# Patient Record
Sex: Male | Born: 1965 | Race: White | Hispanic: No | Marital: Married | Smoking: Never smoker
Health system: Southern US, Community
[De-identification: ages and names within clinical notes are randomized; demographics above are authoritative.]

## PROBLEM LIST (undated history)

## (undated) DIAGNOSIS — J301 Allergic rhinitis due to pollen: Secondary | ICD-10-CM

## (undated) DIAGNOSIS — D169 Benign neoplasm of bone and articular cartilage, unspecified: Secondary | ICD-10-CM

## (undated) DIAGNOSIS — M1990 Unspecified osteoarthritis, unspecified site: Secondary | ICD-10-CM

## (undated) DIAGNOSIS — T7840XA Allergy, unspecified, initial encounter: Secondary | ICD-10-CM

## (undated) HISTORY — DX: Benign neoplasm of bone and articular cartilage, unspecified: D16.9

## (undated) HISTORY — PX: COLONOSCOPY: SHX174

## (undated) HISTORY — DX: Allergic rhinitis due to pollen: J30.1

## (undated) HISTORY — DX: Unspecified osteoarthritis, unspecified site: M19.90

## (undated) HISTORY — DX: Allergy, unspecified, initial encounter: T78.40XA

## (undated) HISTORY — PX: OTHER SURGICAL HISTORY: SHX169

---
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
## 1997-10-23 DIAGNOSIS — D169 Benign neoplasm of bone and articular cartilage, unspecified: Secondary | ICD-10-CM

## 1997-10-23 HISTORY — DX: Benign neoplasm of bone and articular cartilage, unspecified: D16.9

## 1998-06-22 ENCOUNTER — Ambulatory Visit (HOSPITAL_BASED_OUTPATIENT_CLINIC_OR_DEPARTMENT_OTHER): Admission: RE | Admit: 1998-06-22 | Discharge: 1998-06-22 | Payer: Self-pay | Admitting: Orthopedic Surgery

## 2000-05-24 ENCOUNTER — Encounter: Admission: RE | Admit: 2000-05-24 | Discharge: 2000-06-27 | Payer: Self-pay | Admitting: Orthopedic Surgery

## 2005-06-14 ENCOUNTER — Encounter: Admission: RE | Admit: 2005-06-14 | Discharge: 2005-06-14 | Payer: Self-pay | Admitting: Specialist

## 2007-08-07 ENCOUNTER — Encounter: Payer: Self-pay | Admitting: Gastroenterology

## 2007-08-07 ENCOUNTER — Ambulatory Visit: Payer: Self-pay | Admitting: Internal Medicine

## 2007-08-08 LAB — CONVERTED CEMR LAB
Glucose, Bld: 81 mg/dL (ref 70–99)
LDL Cholesterol: 105 mg/dL — ABNORMAL HIGH (ref 0–99)

## 2012-10-08 ENCOUNTER — Telehealth: Payer: Self-pay | Admitting: Internal Medicine

## 2012-10-08 NOTE — Telephone Encounter (Signed)
Pt's wife called to schedule a CPE w/in the next month or so due to them looking into fostering children.  Due to his job he needs an appmt on Wed. Can you accommodate him for a CPE on a Wed w/in the next month? Thank you.

## 2012-10-08 NOTE — Telephone Encounter (Signed)
Left message on machine to have pt's wife return my call, pt last seen 2008, technically would be a new patient. Next CPE/ new patient will be in March.

## 2012-11-04 ENCOUNTER — Encounter: Payer: Self-pay | Admitting: Internal Medicine

## 2012-11-05 ENCOUNTER — Encounter: Payer: Self-pay | Admitting: Internal Medicine

## 2012-11-05 ENCOUNTER — Ambulatory Visit (INDEPENDENT_AMBULATORY_CARE_PROVIDER_SITE_OTHER): Payer: 59 | Admitting: Internal Medicine

## 2012-11-05 VITALS — BP 122/80 | HR 66 | Temp 97.7°F | Ht 67.5 in | Wt 191.0 lb

## 2012-11-05 DIAGNOSIS — Z23 Encounter for immunization: Secondary | ICD-10-CM

## 2012-11-05 DIAGNOSIS — Z0000 Encounter for general adult medical examination without abnormal findings: Secondary | ICD-10-CM

## 2012-11-05 DIAGNOSIS — J301 Allergic rhinitis due to pollen: Secondary | ICD-10-CM | POA: Insufficient documentation

## 2012-11-05 NOTE — Assessment & Plan Note (Signed)
Healthy Needs to work on fitness Tdap Will do forms for foster care

## 2012-11-05 NOTE — Addendum Note (Signed)
Addended by: Sueanne Margarita on: 11/05/2012 09:32 AM   Modules accepted: Orders
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
## 2012-11-07 ENCOUNTER — Encounter: Payer: Self-pay | Admitting: *Deleted

## 2012-11-07 ENCOUNTER — Telehealth: Payer: Self-pay | Admitting: *Deleted

## 2012-11-07 ENCOUNTER — Other Ambulatory Visit (INDEPENDENT_AMBULATORY_CARE_PROVIDER_SITE_OTHER): Payer: 59

## 2012-11-07 LAB — TB SKIN TEST
Induration: 0 mm
TB Skin Test: NEGATIVE

## 2012-11-07 LAB — LIPID PANEL
Cholesterol: 176 mg/dL (ref 0–200)
Total CHOL/HDL Ratio: 4

## 2012-11-07 NOTE — Telephone Encounter (Signed)
Sorry form on your desk now

## 2012-11-07 NOTE — Telephone Encounter (Signed)
Nothing on my desk.

## 2012-11-07 NOTE — Telephone Encounter (Signed)
Please see form on your desk to be filled out for foster care.

## 2012-11-07 NOTE — Telephone Encounter (Signed)
done

## 2012-11-08 NOTE — Telephone Encounter (Signed)
Left message at pt's work advising form is ready for pick up.  Form placed at front desk, copy sent for scanning.

## 2013-04-19 ENCOUNTER — Emergency Department: Payer: Self-pay | Admitting: Internal Medicine

## 2013-08-28 ENCOUNTER — Other Ambulatory Visit: Payer: Self-pay

## 2013-10-09 ENCOUNTER — Telehealth: Payer: Self-pay | Admitting: Family Medicine

## 2013-10-09 ENCOUNTER — Ambulatory Visit: Payer: 59 | Admitting: Family Medicine

## 2013-10-09 ENCOUNTER — Telehealth: Payer: Self-pay | Admitting: Internal Medicine

## 2013-10-09 NOTE — Telephone Encounter (Signed)
Will see today.  

## 2013-10-09 NOTE — Telephone Encounter (Signed)
Caller: Brenda/Spouse; PCP: [facility] ([facility]); CB#: [phone]; Call regarding Fever, congestion, cough and body aches; Onset: 10/06/13.  Temp 101.0 po at 0630.  Advised to see MD within 24 hours per nursing judgement for temperature of 101. for 4 days that has not responded to 24 hours of home care measures per Flu-Like Sympotoms. Dr Alphonsus Sias has no appointments fo 10/09/13. Appointment scheduled for 1400 10/09/13 with Dr Para March.

## 2013-10-09 NOTE — Telephone Encounter (Signed)
Sounds like the flu

## 2013-10-09 NOTE — Telephone Encounter (Signed)
[facility] Triage Call Report Triage Record Num: [id number] Operator: Geanie Berlin Patient Name: Tommy Franklin Call Date & Time: 10/09/2013 9:27:49AM Patient Phone: [phone] PCP: [facility] Abide Patient Gender: Male PCP Fax : [phone] Patient DOB: [birth year] Practice Name: [facility] Reason for Call: Caller: Wyline Copas; PCP: [facility]ide (Family [facility]); CB#: [phone]; Call regarding Fever, congestion, cough and body aches; Onset: 10/06/13. Temp 101.0 po at 0630. Advised to see MD within 24 hours per nursing judgement for temperature of 101. for 4 days that has not responded to 24 hours of home care measures per Flu-Like Sympotoms. Dr Alphonsus Sias has no appointments for 10/09/13. Appointment scheduled for 1400 10/09/13 with Dr Para March. Protocol(s) Used: Flu-Like Symptoms Protocol(s) Used: Upper Respiratory Infection (URI) Recommended Outcome per Protocol: See Provider within 24 hours Reason for Outcome: Temperature of 101.5 F (38.6 C) or greater that has not responded to 24 hours of home care measures Sudden onset of flu-like symptoms Care Advice: ~ Rest until symptoms improve. Aspirin should not be used in anyone, 48 years of age and under, for temperature control, pain, or aches when flu is a possibility. ~ ~ SYMPTOM / CONDITION MANAGEMENT Analgesic/Antipyretic Advice - Acetaminophen: Consider acetaminophen as directed on label or by pharmacist/provider for pain or fever. PRECAUTIONS: - Use only if there is no history of liver disease, alcoholism, or intake of three or more alcohol drinks per day. - If approved by provider when breastfeeding. - Do not exceed recommended dose or frequency. ~ Analgesic/Antipyretic Advice - NSAIDs: Consider aspirin, ibuprofen, naproxen or ketoprofen for pain or fever as directed on label or by pharmacist/provider. PRECAUTIONS: - If over 96 years of age, should not take longer than 1 week without consulting  provider. EXCEPTIONS: - Should not be used if taking blood thinners or have bleeding problems. - Do not use if have history of sensitivity/allergy to any of these medications; or history of cardiovascular, ulcer, kidney, liver disease or diabetes unless approved by provider. - Do not exceed recommended dose or frequency. ~ Influenza - Expected Course: - Symptoms start to improve in 3 to 7 days. - Cough and feeling tired (malaise) may continue for several weeks. - Cough and extreme fatigue lasting more than 3 weeks needs medical evaluation. - Remain at home at least 24 hours after being free of fever 100F (37.8C) without the use of fever reducing medication. ~ Influenza - Transmission: - Flu virus is spread through the air in droplets when a flu-infected person coughs, sneezes or talks and another person breathes in the droplets, or by touching a surface like a door knob, telephone, or keyboard that has been contaminated ~ 10/09/2013 9:51:50AM Page 1 of 2 CAN_[id number]_V2 [facility] Triage Call Report Patient Name: Tommy Franklin continuation page/s by the droplets and then touching the mouth, nose, or eyes. - An individual may be passing on the flu before they have any symptoms. - An individual may continue to be contagious with the flu for up to 7 days after the symptoms start. H1N1 influenza may continue to be contagious as long as cough persists. Influenza Respiratory Hygiene: Cover the nose/mouth tightly with a tissue when coughing or sneezing. - Use tissue one time and discard in the nearest waste receptacle. - Wash hands with soap and water or alcohol-based hand rub for at least 15 seconds after coming into contact with respiratory secretions and contaminated objects/materials. - When a tissue is not available, cough into the bend of the elbow. - Avoid touching your eyes,  nose or mouth. - When ill wear a disposable face mask when around others, especially around those at high  risk for flu-related complications, to help decrease the likelihood of infecting them. ~ ~ If having flu-like symptoms, postpone any travel, especially international travel. You may be quarantined. Total water intake includes drinking water, water in beverages, and water contained in food. Fluids make up about 80% of the body's total hydration need. Individual fluid requirement to maintain hydration vary based on physical activity, environmental factors and illness. Limit fluids that contain sugar, caffeine, or alcohol. Urine will be very light yellow color when you drink enough fluids. ~ 10/09/2013 9:51:50AM Page 2 of 2 CAN_TriageRpt_V2

## 2014-08-07 ENCOUNTER — Other Ambulatory Visit: Payer: Self-pay

## 2016-08-24 ENCOUNTER — Other Ambulatory Visit: Payer: Self-pay | Admitting: Physical Medicine and Rehabilitation

## 2016-08-24 DIAGNOSIS — M50823 Other cervical disc disorders at C6-C7 level: Secondary | ICD-10-CM

## 2016-08-30 ENCOUNTER — Ambulatory Visit
Admission: RE | Admit: 2016-08-30 | Discharge: 2016-08-30 | Disposition: A | Discharge status: Home / Self Care | Payer: Self-pay | Source: Ambulatory Visit | Attending: Physical Medicine and Rehabilitation | Admitting: Physical Medicine and Rehabilitation

## 2016-08-30 DIAGNOSIS — M50823 Other cervical disc disorders at C6-C7 level: Secondary | ICD-10-CM

## 2016-10-25 DIAGNOSIS — M5413 Radiculopathy, cervicothoracic region: Secondary | ICD-10-CM | POA: Diagnosis not present

## 2016-10-25 DIAGNOSIS — M542 Cervicalgia: Secondary | ICD-10-CM | POA: Diagnosis not present

## 2016-10-25 DIAGNOSIS — M5412 Radiculopathy, cervical region: Secondary | ICD-10-CM | POA: Diagnosis not present

## 2016-11-01 DIAGNOSIS — M5412 Radiculopathy, cervical region: Secondary | ICD-10-CM | POA: Diagnosis not present

## 2016-11-01 DIAGNOSIS — M542 Cervicalgia: Secondary | ICD-10-CM | POA: Diagnosis not present

## 2016-11-01 DIAGNOSIS — M5413 Radiculopathy, cervicothoracic region: Secondary | ICD-10-CM | POA: Diagnosis not present

## 2016-12-05 DIAGNOSIS — J45991 Cough variant asthma: Secondary | ICD-10-CM | POA: Diagnosis not present

## 2016-12-05 DIAGNOSIS — J309 Allergic rhinitis, unspecified: Secondary | ICD-10-CM | POA: Diagnosis not present

## 2016-12-06 DIAGNOSIS — J301 Allergic rhinitis due to pollen: Secondary | ICD-10-CM | POA: Diagnosis not present

## 2017-04-02 ENCOUNTER — Telehealth: Payer: Self-pay | Admitting: Internal Medicine

## 2017-04-02 NOTE — Telephone Encounter (Signed)
Patient's wife,Brenda,called.  She said patient has a rectal pea size mass.  Patient's wife is a Marine scientist and she said she doesn't think it's a hemorrhoid, but it could be a blood vessel because it's dark blue.  It's firm and tender.  Patient just wants to see Dr.Letvak.  The next available appointment is Wednesday and it's same day appointments.  Please advise.

## 2017-04-02 NOTE — Telephone Encounter (Signed)
I spoke to patient's wife and scheduled appointment for 04/03/17 at 12:00.

## 2017-04-02 NOTE — Telephone Encounter (Signed)
Can add tomorrow at noon (or 12:15)---have another patient that may be taking noon tomorrow (check with Larene Beach). Wednesday same day also okay

## 2017-04-03 ENCOUNTER — Ambulatory Visit (INDEPENDENT_AMBULATORY_CARE_PROVIDER_SITE_OTHER): Payer: 59 | Admitting: Internal Medicine

## 2017-04-03 ENCOUNTER — Encounter: Payer: Self-pay | Admitting: Internal Medicine

## 2017-04-03 VITALS — BP 104/66 | HR 62 | Temp 98.2°F | Wt 168.2 lb

## 2017-04-03 DIAGNOSIS — Z1211 Encounter for screening for malignant neoplasm of colon: Secondary | ICD-10-CM | POA: Diagnosis not present

## 2017-04-03 DIAGNOSIS — K644 Residual hemorrhoidal skin tags: Secondary | ICD-10-CM | POA: Diagnosis not present

## 2017-04-03 MED ORDER — HYDROCORTISONE 2.5 % EX CREA: TOPICAL_CREAM | Freq: Three times a day (TID) | CUTANEOUS | 3 refills | Status: AC | PRN

## 2017-04-03 NOTE — Progress Notes (Signed)
   Subjective:    Patient ID: ATHA MCBAIN, male    DOB: 06-08-1966, 51 y.o.   MRN: [id number]  HPI Here due to mass at rectum  Noticed a lump at rectum 2 days ago Spent a lot of time riding horses over the weekend Noticed in shower Pea sized Slightly tender  No blood No pain with stools  No treatment  Current Outpatient Prescriptions on File Prior to Visit  Medication Sig Dispense Refill  . loratadine (CLARITIN) 10 MG tablet Take 10-20 mg by mouth daily as needed.    . mometasone (NASONEX) 50 MCG/ACT nasal spray Place 2 sprays into the nose daily. Use in spring allergy season     No current facility-administered medications on file prior to visit.     No Known Allergies  Past Medical History:  Diagnosis Date  . Allergic rhinitis due to pollen   . 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 TONSILLECTOMY AND ADENOIDECTOMY  1984    Family History  Problem Relation Age of Onset  . Healthy Mother   . Hypertension Father   . Diabetes Paternal Aunt   . Cancer Paternal Grandmother        presumed lung cancer  . CAD Neg Hx     Social History   Social History  . Marital status: Married    Spouse name: N/A  . Number of children: 2  . Years of education: N/A   Occupational History  . 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 Smoking status: Never Smoker  . Smokeless tobacco: Never Used  . Alcohol use Yes     Comment: occasional only  . Drug use: No  . Sexual activity: Not on file   Other Topics Concern  . Not on file   Social History Narrative  . No narrative on file   Review of Systems  No N/V Appetite is fine     Objective:   Physical Exam  Genitourinary:  Genitourinary Comments: Small purplish mass (3-62mm) slightly away from rectum Mild perirectal inflammation No internal lesions Prostate exam normal          Assessment & Plan:

## 2017-04-03 NOTE — Assessment & Plan Note (Signed)
Small Just mild venous dilation HC cream prn Will set up colonoscopy though

## 2017-04-17 ENCOUNTER — Telehealth: Payer: Self-pay

## 2017-04-17 ENCOUNTER — Other Ambulatory Visit: Payer: Self-pay

## 2017-04-17 DIAGNOSIS — Z1211 Encounter for screening for malignant neoplasm of colon: Secondary | ICD-10-CM

## 2017-04-17 NOTE — Telephone Encounter (Signed)
Gastroenterology Pre-Procedure Review  Request Date: 05/17/17 Requesting Physician: Dr. Vicente Males  PATIENT REVIEW QUESTIONS: The patient responded to the following health history questions as indicated:    1. Are you having any GI issues? no 2. Do you have a personal history of Polyps? no 3. Do you have a family history of Colon Cancer or Polyps? no 4. Diabetes Mellitus? no 5. Joint replacements in the past 12 months?no 6. Major health problems in the past 3 months?no 7. Any artificial heart valves, MVP, or defibrillator?no    MEDICATIONS & ALLERGIES:    Patient reports the following regarding taking any anticoagulation/antiplatelet therapy:   Plavix, Coumadin, Eliquis, Xarelto, Lovenox, Pradaxa, Brilinta, or Effient? no Aspirin? no  Patient confirms/reports the following medications:  Current Outpatient Prescriptions  Medication Sig Dispense Refill  . hydrocortisone 2.5 % cream Apply topically 3 (three) times daily as needed. 28 g 3  . loratadine (CLARITIN) 10 MG tablet Take 10-20 mg by mouth daily as needed.    . mometasone (NASONEX) 50 MCG/ACT nasal spray Place 2 sprays into the nose daily. Use in spring allergy season    . Omega-3 Fatty Acids (FISH OIL) 600 MG CAPS Take 1,200 mg by mouth 2 (two) times daily.    . TURMERIC PO Take by mouth daily.     No current facility-administered medications for this visit.     Patient confirms/reports the following allergies:  No Known Allergies  No orders of the defined types were placed in this encounter.   AUTHORIZATION INFORMATION Primary Insurance: 1D#: Group #:  Secondary Insurance: 1D#: Group #:  SCHEDULE INFORMATION: Date: 05/17/17 Time: Location:[facility]

## 2017-04-19 ENCOUNTER — Telehealth: Payer: Self-pay | Admitting: Gastroenterology

## 2017-04-19 NOTE — Telephone Encounter (Signed)
Patient needs to reschedule his colonoscopy due to a race.

## 2017-04-20 ENCOUNTER — Telehealth: Payer: Self-pay

## 2017-04-20 NOTE — Telephone Encounter (Signed)
LVM for patient callback to reschedule per phone message received.   Patient needs to reschedule his colonoscopy due to a race.

## 2017-04-23 ENCOUNTER — Telehealth: Payer: Self-pay | Admitting: Gastroenterology

## 2017-04-23 NOTE — Telephone Encounter (Signed)
LVM on pts home and cell # to contact office to call back to return call to re-schedule colonoscopy.

## 2017-04-23 NOTE — Telephone Encounter (Signed)
Please call patient as he needs to r/s his procedure.

## 2017-04-23 NOTE — Telephone Encounter (Signed)
LVM for pt to return my call.

## 2017-04-27 ENCOUNTER — Telehealth: Payer: Self-pay

## 2017-04-27 NOTE — Telephone Encounter (Signed)
Patients appointment has been given to him from previous reschedule of colonoscopy.

## 2017-05-30 ENCOUNTER — Encounter: Payer: Self-pay | Admitting: *Deleted

## 2017-05-31 ENCOUNTER — Ambulatory Visit: Admission: RE | Admit: 2017-05-31 | Payer: 59 | Source: Ambulatory Visit | Admitting: Gastroenterology

## 2017-05-31 ENCOUNTER — Encounter: Admission: RE | Payer: Self-pay | Source: Ambulatory Visit

## 2017-05-31 SURGERY — COLONOSCOPY WITH PROPOFOL
Anesthesia: General

## 2017-08-09 DIAGNOSIS — H5213 Myopia, bilateral: Secondary | ICD-10-CM | POA: Diagnosis not present

## 2017-12-11 ENCOUNTER — Telehealth: Payer: Self-pay | Admitting: *Deleted

## 2017-12-11 DIAGNOSIS — Z1211 Encounter for screening for malignant neoplasm of colon: Secondary | ICD-10-CM

## 2017-12-11 NOTE — Telephone Encounter (Signed)
Copied from [place]. Topic: Referral - Request >> Dec 11, 2017  2:23 PM Aurelio Brash B wrote: Reason for CRM: pt is requesting a referral for a colonoscopy.  He states that Dr Silvio Pate had made the referral for him back in June but his daughter had health issues and it got pushed to the side.

## 2017-12-12 NOTE — Telephone Encounter (Signed)
Please let him know referral placed---he should hear about this soon

## 2017-12-12 NOTE — Telephone Encounter (Signed)
Left detailed message on VM per DPR. 

## 2017-12-13 ENCOUNTER — Encounter: Payer: Self-pay | Admitting: Gastroenterology

## 2018-01-16 ENCOUNTER — Ambulatory Visit (AMBULATORY_SURGERY_CENTER): Payer: Self-pay

## 2018-01-16 VITALS — Ht 66.0 in | Wt 173.6 lb

## 2018-01-16 DIAGNOSIS — Z1211 Encounter for screening for malignant neoplasm of colon: Secondary | ICD-10-CM

## 2018-01-16 MED ORDER — NA SULFATE-K SULFATE-MG SULF 17.5-3.13-1.6 GM/177ML PO SOLN: 1.0000 | Freq: Once | ORAL | 0 refills | Status: AC

## 2018-01-16 NOTE — Progress Notes (Signed)
Per pt, no allergies to soy or egg products.Pt not taking any weight loss meds or using  O2 at home.  Pt refused emmi video. 

## 2018-01-22 ENCOUNTER — Encounter: Payer: Self-pay | Admitting: Gastroenterology

## 2018-01-30 ENCOUNTER — Other Ambulatory Visit: Payer: Self-pay

## 2018-01-30 ENCOUNTER — Encounter: Payer: Self-pay | Admitting: Gastroenterology

## 2018-01-30 ENCOUNTER — Ambulatory Visit (AMBULATORY_SURGERY_CENTER): Payer: 59 | Admitting: Gastroenterology

## 2018-01-30 VITALS — BP 99/61 | HR 56 | Temp 97.8°F | Resp 18 | Ht 66.0 in | Wt 168.0 lb

## 2018-01-30 DIAGNOSIS — Z1211 Encounter for screening for malignant neoplasm of colon: Secondary | ICD-10-CM | POA: Diagnosis present

## 2018-01-30 DIAGNOSIS — D122 Benign neoplasm of ascending colon: Secondary | ICD-10-CM | POA: Diagnosis not present

## 2018-01-30 DIAGNOSIS — D123 Benign neoplasm of transverse colon: Secondary | ICD-10-CM

## 2018-01-30 MED ORDER — SODIUM CHLORIDE 0.9 % IV SOLN: 500.0000 mL | INTRAVENOUS | Status: DC

## 2018-01-30 NOTE — Patient Instructions (Signed)
Information on polyps and hemorrhoids given to you today.  Await pathology results.   YOU HAD AN ENDOSCOPIC PROCEDURE TODAY AT THE [facility]:   Refer to the procedure report that was given to you for any specific questions about what was found during the examination.  If the procedure report does not answer your questions, please call your gastroenterologist to clarify.  If you requested that your care partner not be given the details of your procedure findings, then the procedure report has been included in a sealed envelope for you to review at your convenience later.  YOU SHOULD EXPECT: Some feelings of bloating in the abdomen. Passage of more gas than usual.  Walking can help get rid of the air that was put into your GI tract during the procedure and reduce the bloating. If you had a lower endoscopy (such as a colonoscopy or flexible sigmoidoscopy) you may notice spotting of blood in your stool or on the toilet paper. If you underwent a bowel prep for your procedure, you may not have a normal bowel movement for a few days.  Please Note:  You might notice some irritation and congestion in your nose or some drainage.  This is from the oxygen used during your procedure.  There is no need for concern and it should clear up in a day or so.  SYMPTOMS TO REPORT IMMEDIATELY:   Following lower endoscopy (colonoscopy or flexible sigmoidoscopy):  Excessive amounts of blood in the stool  Significant tenderness or worsening of abdominal pains  Swelling of the abdomen that is new, acute  Fever of 100F or higher   For urgent or emergent issues, a gastroenterologist can be reached at any hour by calling [phone].   DIET:  We do recommend a small meal at first, but then you may proceed to your regular diet.  Drink plenty of fluids but you should avoid alcoholic beverages for 24 hours.  ACTIVITY:  You should plan to take it easy for the rest of today and you should NOT DRIVE or use  heavy machinery until tomorrow (because of the sedation medicines used during the test).    FOLLOW UP: Our staff will call the number listed on your records the next business day following your procedure to check on you and address any questions or concerns that you may have regarding the information given to you following your procedure. If we do not reach you, we will leave a message.  However, if you are feeling well and you are not experiencing any problems, there is no need to return our call.  We will assume that you have returned to your regular daily activities without incident.  If any biopsies were taken you will be contacted by phone or by letter within the next 1-3 weeks.  Please call us at [phone] if you have not heard about the biopsies in 3 weeks.    SIGNATURES/CONFIDENTIALITY: You and/or your care partner have signed paperwork which will be entered into your electronic medical record.  These signatures attest to the fact that that the information above on your After Visit Summary has been reviewed and is understood.  Full responsibility of the confidentiality of this discharge information lies with you and/or your care-partner. 

## 2018-01-30 NOTE — Progress Notes (Signed)
Pt's states no medical or surgical changes since previsit or office visit. 

## 2018-01-30 NOTE — Progress Notes (Signed)
Called to room to assist during endoscopic procedure.  Patient ID and intended procedure confirmed with present staff. Received instructions for my participation in the procedure from the performing physician.  

## 2018-01-30 NOTE — Op Note (Signed)
[facility] Patient Name: Tommy Franklin Procedure Date: 01/30/2018 8:17 AM MRN: [id number] Endoscopist: Remo Lipps P. Micheline Markes MD, MD Age: 52 Referring MD:  Date of Birth: 11/17/1965 Gender: Male Account #: [id number] Procedure:                Colonoscopy Indications:              Screening for colorectal malignant neoplasm, This                            is the patient's first colonoscopy Medicines:                Monitored Anesthesia Care Procedure:                Pre-Anesthesia Assessment:                           - Prior to the procedure, a History and Physical                            was performed, and patient medications and                            allergies were reviewed. The patient's tolerance of                            previous anesthesia was also reviewed. The risks                            and benefits of the procedure and the sedation                            options and risks were discussed with the patient.                            All questions were answered, and informed consent                            was obtained. Prior Anticoagulants: The patient has                            taken no previous anticoagulant or antiplatelet                            agents. ASA Grade Assessment: II - A patient with                            mild systemic disease. After reviewing the risks                            and benefits, the patient was deemed in                            satisfactory condition to undergo the procedure.  After obtaining informed consent, the colonoscope                            was passed under direct vision. Throughout the                            procedure, the patient's blood pressure, pulse, and                            oxygen saturations were monitored continuously. The                            Colonoscope was introduced through the anus and                            advanced to the the  cecum, identified by                            appendiceal orifice and ileocecal valve. The                            colonoscopy was performed without difficulty. The                            patient tolerated the procedure well. The quality                            of the bowel preparation was adequate. The                            ileocecal valve, appendiceal orifice, and rectum                            were photographed. Scope In: 8:22:40 AM Scope Out: 8:47:27 AM Scope Withdrawal Time: 0 hours 21 minutes 51 seconds  Total Procedure Duration: 0 hours 24 minutes 47 seconds  Findings:                 The perianal and digital rectal examinations were                            normal.                           The left colon was redundant and the colon was                            tortous which prolonged this procedure.                           A 5 to 6 mm polyp was found in the ascending colon.                            The polyp was sessile. The polyp was removed with a  cold snare. Resection and retrieval were complete.                           A 7 mm polyp was found in the hepatic flexure. The                            polyp was flat. The polyp was removed with a cold                            snare. Resection and retrieval were complete.                           Internal hemorrhoids were found during retroflexion.                           The exam was otherwise without abnormality. Complications:            No immediate complications. Estimated blood loss:                            Minimal. Estimated Blood Loss:     Estimated blood loss was minimal. Impression:               - Redundant colon.                           - One 5 to 6 mm polyp in the ascending colon,                            removed with a cold snare. Resected and retrieved.                           - One 7 mm polyp at the hepatic flexure, removed                             with a cold snare. Resected and retrieved.                           - Internal hemorrhoids.                           - The examination was otherwise normal. Recommendation:           - Patient has a contact number available for                            emergencies. The signs and symptoms of potential                            delayed complications were discussed with the                            patient. Return to normal activities tomorrow.  Written discharge instructions were provided to the                            patient.                           - Resume previous diet.                           - Continue present medications.                           - Await pathology results.                           - Repeat colonoscopy is recommended for                            surveillance. The colonoscopy date will be                            determined after pathology results from today's                            exam become available for review. Remo Lipps P. Jaskirat Zertuche MD, MD 01/30/2018 8:53:51 AM This report has been signed electronically.

## 2018-01-30 NOTE — Progress Notes (Signed)
To PACU, VSS. Report to [email or web address] 

## 2018-01-31 ENCOUNTER — Telehealth: Payer: Self-pay | Admitting: *Deleted

## 2018-01-31 NOTE — Telephone Encounter (Signed)
  Follow up Call-  Call back number 01/30/2018  Post procedure Call Back phone  # [phone]  Permission to leave phone message Yes  Some recent data might be hidden     Patient questions:  Do you have a fever, pain , or abdominal swelling? No. Pain Score  0 *  Have you tolerated food without any problems? Yes.    Have you been able to return to your normal activities? Yes.    Do you have any questions about your discharge instructions: Diet   No. Medications  No. Follow up visit  No.  Do you have questions or concerns about your Care? No.  Actions: * If pain score is 4 or above: No action needed, pain <4.

## 2018-02-08 ENCOUNTER — Encounter: Payer: Self-pay | Admitting: Gastroenterology

## 2018-02-11 ENCOUNTER — Encounter: Payer: Self-pay | Admitting: Gastroenterology

## 2018-04-01 IMAGING — CT CT CERVICAL SPINE W/O CM
3 of 5 series · 15 of 33 positions shown, 18 images · non-contrast
Comparison: None.

CLINICAL DATA: Cervical disc disorder at C6-7. Bilateral upper
extremity pain, worse on the right. Right-sided numbness and
weakness. Neck pain.

EXAM:
CT CERVICAL SPINE WITHOUT CONTRAST
TECHNIQUE: Multidetector CT imaging of the cervical spine was performed without
intravenous contrast. Multiplanar CT image reconstructions were also
generated.

[Series 200: cor · coronal · 0.43mm/px · 3 of 52 slices shown]
[im 11/52  bone]
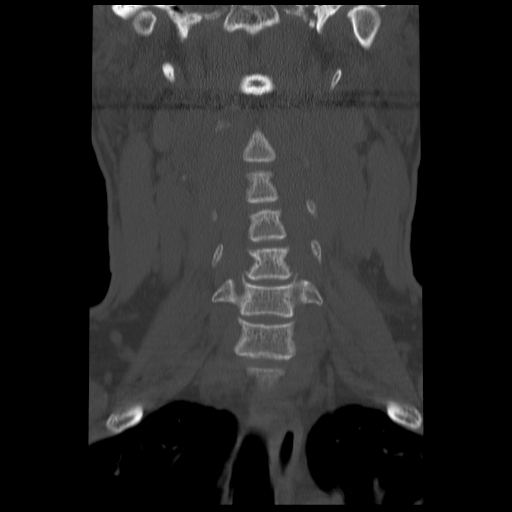
[im 21/52  bone]
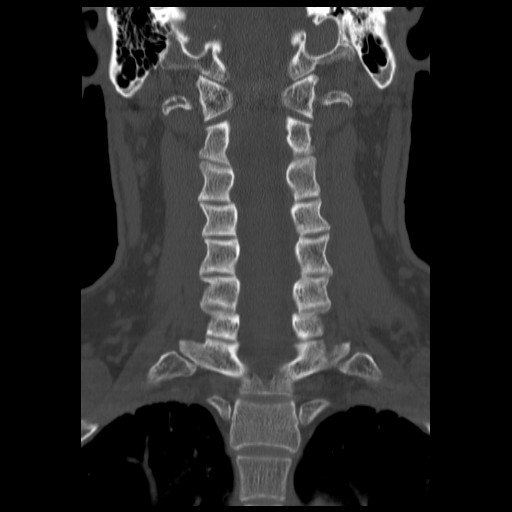
[im 31/52  bone]
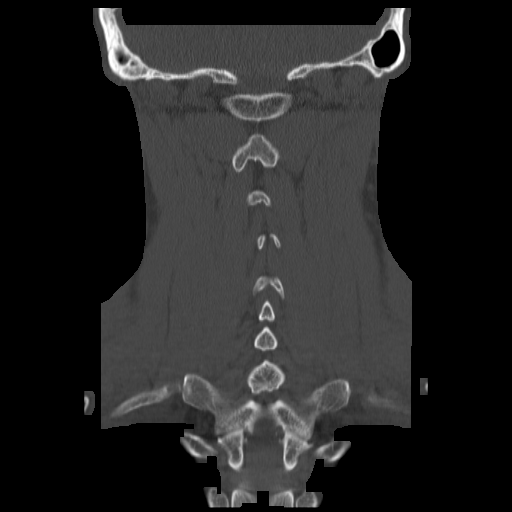

[Series 201: sag · sagittal · 0.43mm/px · 5 of 52 slices shown, 6 images]
[im 18/52  bone]
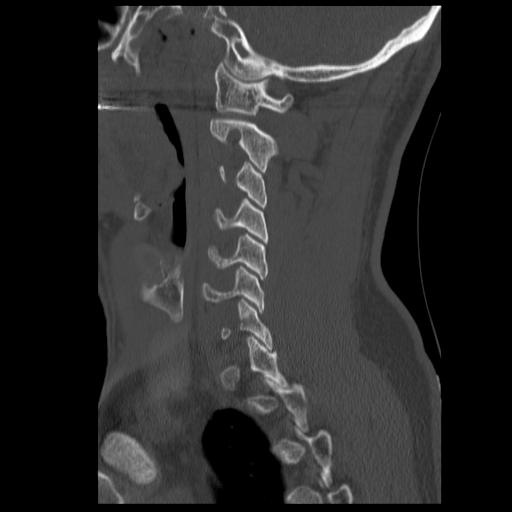
[im 22/52  bone]
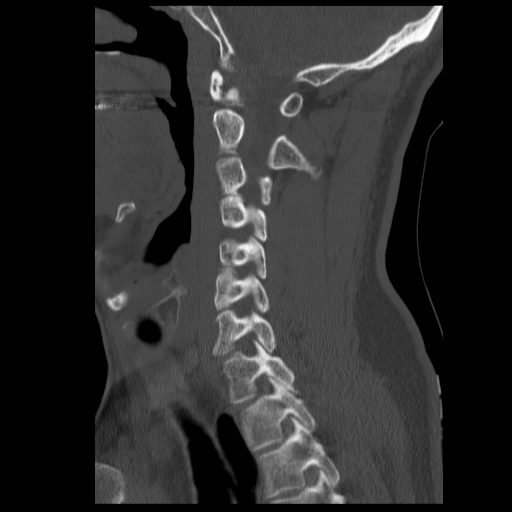
[im 26/52  soft-tissue]
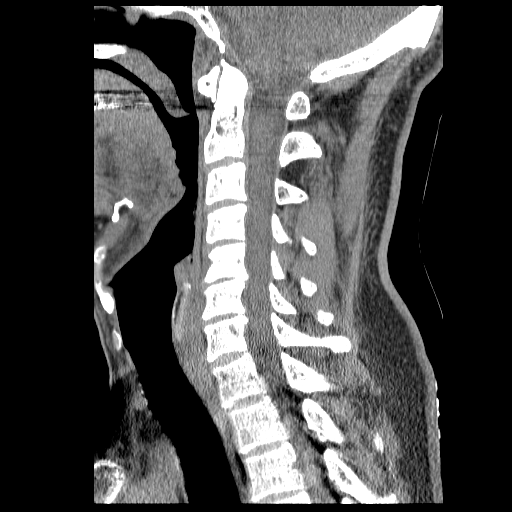
[im 26/52  bone]
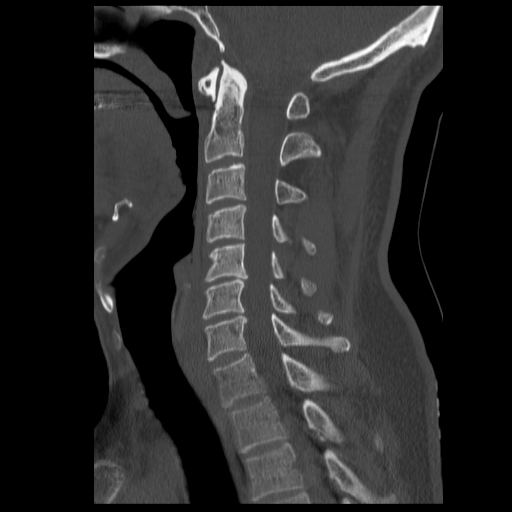
[im 30/52  bone]
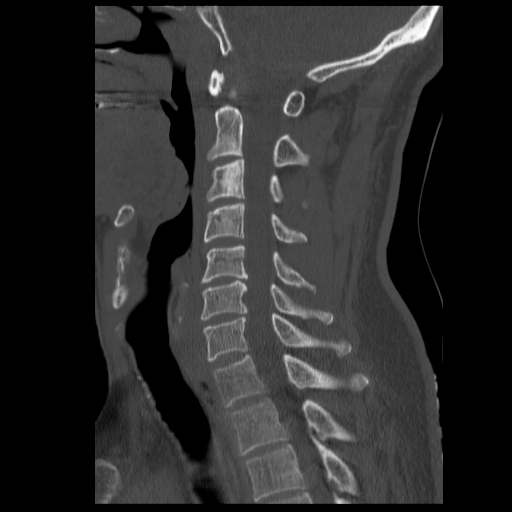
[im 35/52  bone]
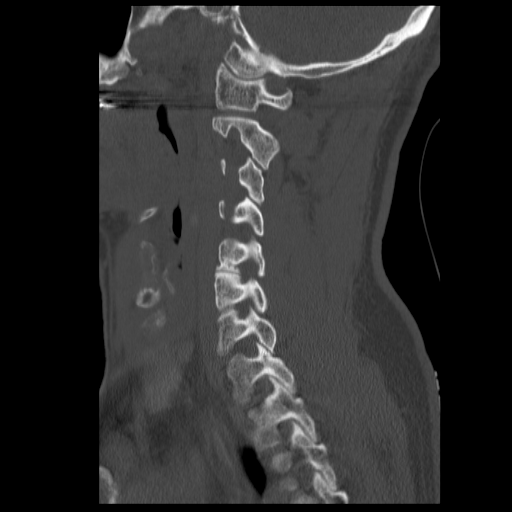

[Series 202: axial · axial · 0.20mm/px · z∈[-41,+123]mm · 7 of 313 slices shown, 9 images]
[im 40/313  soft-tissue]
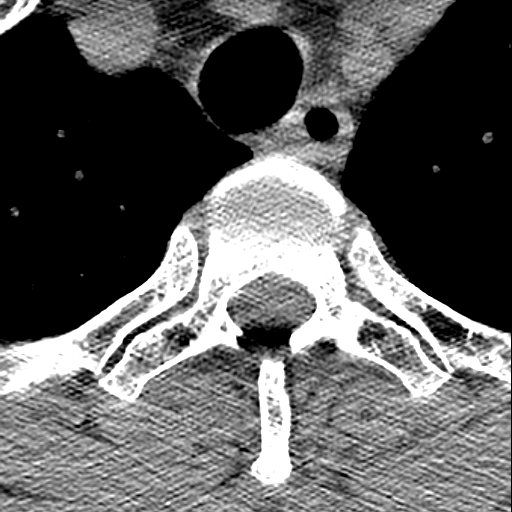
[im 40/313  bone]
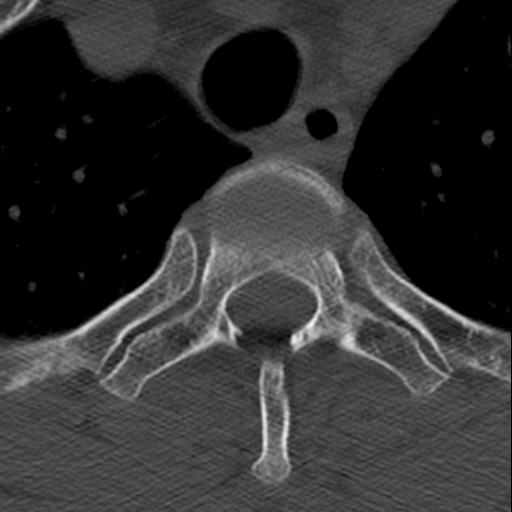
[im 79/313  bone]
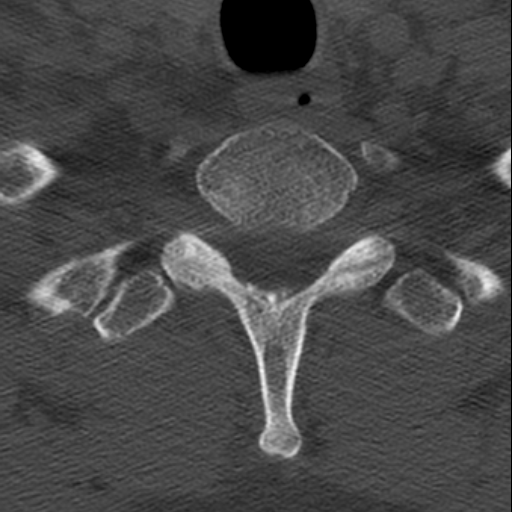
[im 118/313  bone]
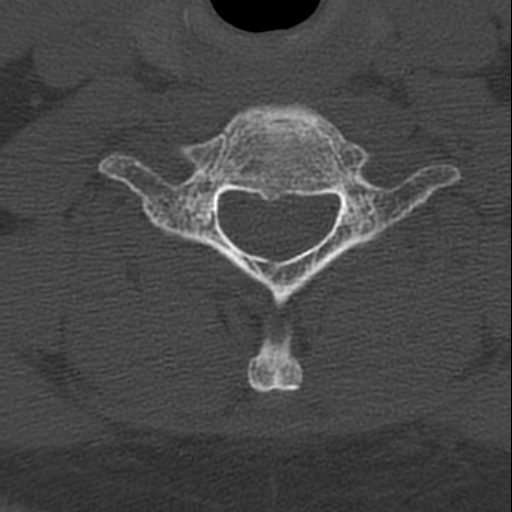
[im 157/313  bone]
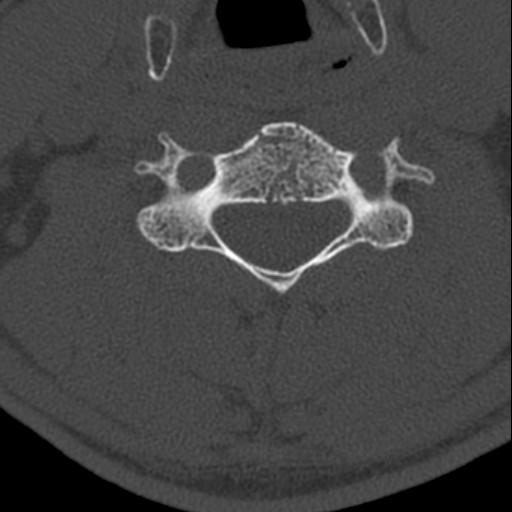
[im 196/313  soft-tissue]
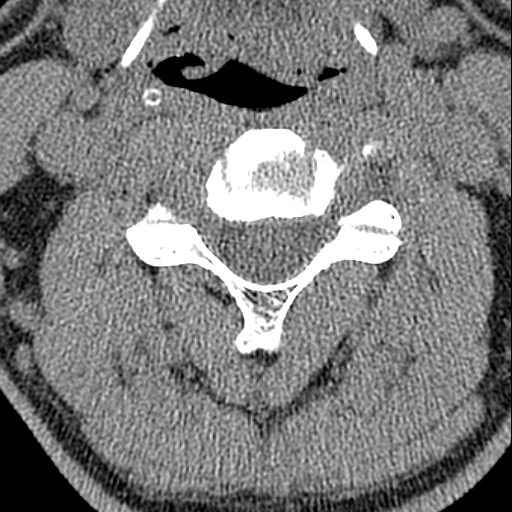
[im 196/313  bone]
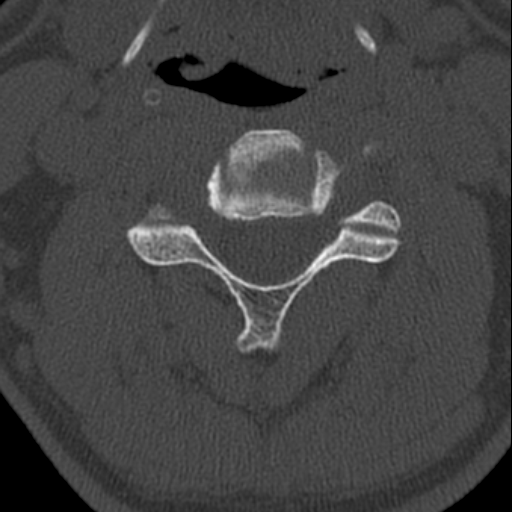
[im 235/313  bone]
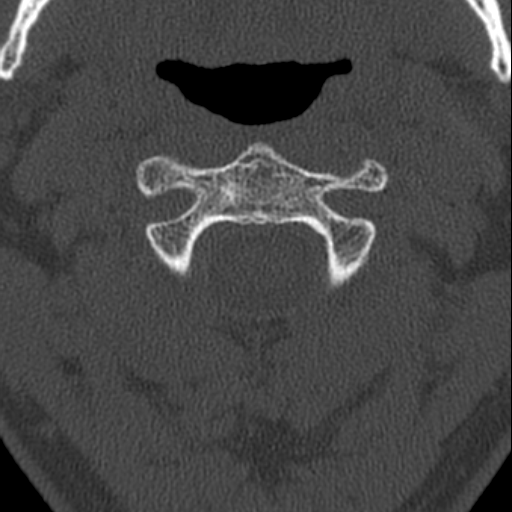
[im 274/313  bone]
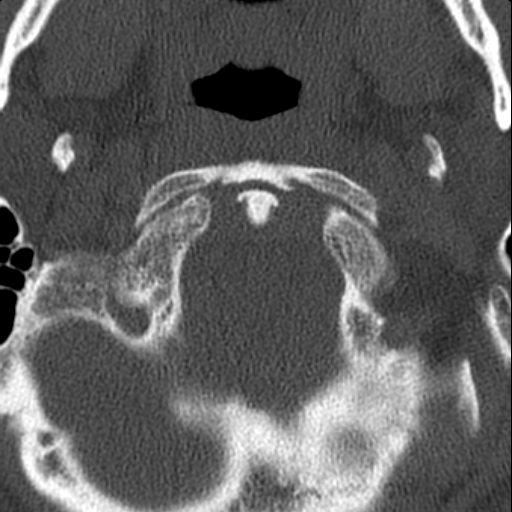

[15 of 33 positions shown; findings below may reference images not displayed]

FINDINGS: Alignment: AP alignment is anatomic. Mild leftward curvature is
present in the lower cervical spine.

Skull base and vertebrae: The craniocervical junction is within
normal limits. Mild endplate changes are present at C5-6, worse on
the right. A focal area of increased density is seen posteriorly
within the C3 vertebral body on the left. This is most compatible
with a bone island. No other focal sclerotic lesions are present.
The

Soft tissues and spinal canal: The soft tissues the neck are
unremarkable. There is no significant adenopathy. No focal mucosal
lesions. The thyroid is normal. The salivary glands are within
normal limits.

Disc levels:  C2-3:  Negative.

C3-4: Mild uncovertebral spurring is present bilaterally without
significant focal stenosis.

C4-5:  Negative.

C5-6: Uncovertebral and facet disease results in moderate foraminal
stenosis bilaterally.

C6-7: Asymmetric left-sided uncovertebral spurring results in mild
left foraminal narrowing.

C7-T1:  Negative.

Upper chest: The lung apices are clear. The superior mediastinum is
within normal limits.
IMPRESSION: 1. Moderate foraminal stenosis bilaterally at C5-6 secondary to
uncovertebral disease.
2. Mild uncovertebral spurring is present bilaterally without
significant focal stenosis at C3-4.
3. Asymmetric left-sided uncovertebral spurring and mild foraminal
stenosis at C6-7.

## 2018-08-06 DIAGNOSIS — Z23 Encounter for immunization: Secondary | ICD-10-CM | POA: Diagnosis not present

## 2018-12-23 ENCOUNTER — Emergency Department
Admission: EM | Admit: 2018-12-23 | Discharge: 2018-12-23 | Disposition: A | Discharge status: Home / Self Care | Payer: 59 | Attending: Emergency Medicine | Admitting: Emergency Medicine

## 2018-12-23 ENCOUNTER — Encounter: Payer: Self-pay | Admitting: Emergency Medicine

## 2018-12-23 ENCOUNTER — Other Ambulatory Visit: Payer: Self-pay

## 2018-12-23 DIAGNOSIS — J101 Influenza due to other identified influenza virus with other respiratory manifestations: Secondary | ICD-10-CM | POA: Insufficient documentation

## 2018-12-23 DIAGNOSIS — Z79899 Other long term (current) drug therapy: Secondary | ICD-10-CM | POA: Insufficient documentation

## 2018-12-23 DIAGNOSIS — R509 Fever, unspecified: Secondary | ICD-10-CM | POA: Diagnosis not present

## 2018-12-23 LAB — INFLUENZA PANEL BY PCR (TYPE A & B)
INFLBPCR: NEGATIVE
Influenza A By PCR: POSITIVE — AB

## 2018-12-23 MED ORDER — OSELTAMIVIR PHOSPHATE 75 MG PO CAPS: 75.0000 mg | ORAL_CAPSULE | Freq: Two times a day (BID) | ORAL | 0 refills | Status: AC

## 2018-12-23 MED ORDER — BENZONATATE 100 MG PO CAPS: 100.0000 mg | ORAL_CAPSULE | Freq: Four times a day (QID) | ORAL | 0 refills | Status: AC | PRN

## 2018-12-23 MED ORDER — ACETAMINOPHEN 325 MG PO TABS
650.0000 mg | ORAL_TABLET | Freq: Once | ORAL | Status: AC
  Administered 2018-12-23: 650 mg via ORAL
  Filled 2018-12-23: qty 2

## 2018-12-23 MED ORDER — IBUPROFEN 600 MG PO TABS
600.0000 mg | ORAL_TABLET | Freq: Once | ORAL | Status: AC
  Administered 2018-12-23: 600 mg via ORAL
  Filled 2018-12-23: qty 1

## 2018-12-23 MED ORDER — ONDANSETRON 4 MG PO TBDP: 4.0000 mg | ORAL_TABLET | Freq: Three times a day (TID) | ORAL | 0 refills | Status: AC | PRN

## 2018-12-23 NOTE — ED Provider Notes (Signed)
[facility] Emergency Department Provider Note  ____________________________________________  Time seen: Approximately 10:46 PM  I have reviewed the triage vital signs and the nursing notes.   HISTORY  Chief Complaint Fever    HPI Tommy Franklin is a 53 y.o. male who presents the emergency department with sudden onset of flulike symptoms of body aches, nasal congestion, sore throat, cough, nausea and vomiting, fevers and chills.  Patient reports that he believes he has influenza and is requesting Tamiflu.  He denies any headache or neck pain.  No neck stiffness.  No chest pain, shortness of breath, abdominal pain, diarrhea or constipation.  Patient is taking Tylenol Motrin at home.  No other complaints.    Past Medical History:  Diagnosis Date  . Allergic rhinitis due to pollen   . Allergy    seasonal  . 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 Hemorrhoids, external 04/03/2017  . Routine general medical examination at a health care facility 11/05/2012  . Allergic rhinitis due to pollen     Past Surgical History:  Procedure Laterality Date  . TONSILLECTOMY AND ADENOIDECTOMY  1984    Prior to Admission medications   Medication Sig Start Date End Date Taking? Authorizing Provider  beclomethasone (QVAR) 40 MCG/ACT inhaler Inhale 2 puffs into the lungs 2 (two) times daily.    [provider]  benzonatate (TESSALON PERLES) 100 MG capsule Take 1 capsule (100 mg total) by mouth every 6 (six) hours as needed for cough. 12/23/18 12/23/19  Cuthriell, Charline Bills, PA-C  Glucosamine 500 MG CAPS Take by mouth. Take 3 capsules daily    [provider]  hydrocortisone 2.5 % cream Apply topically 3 (three) times daily as needed. Patient not taking: Reported on 01/16/2018 04/03/17   Viviana Simpler I, MD  Ibuprofen (ADVIL) 200 MG CAPS Take by mouth as needed.    [provider]  loratadine  (CLARITIN) 10 MG tablet Take 10-20 mg by mouth daily.     [provider]  mometasone (NASONEX) 50 MCG/ACT nasal spray Place 2 sprays into the nose daily. Use in spring allergy season    [provider]  Omega-3 Fatty Acids (FISH OIL) 600 MG CAPS Take 1,200 mg by mouth 2 (two) times daily.    [provider]  ondansetron (ZOFRAN-ODT) 4 MG disintegrating tablet Take 1 tablet (4 mg total) by mouth every 8 (eight) hours as needed for nausea or vomiting. 12/23/18   Cuthriell, Charline Bills, PA-C  oseltamivir (TAMIFLU) 75 MG capsule Take 1 capsule (75 mg total) by mouth 2 (two) times daily. 12/23/18   Cuthriell, Charline Bills, PA-C  TURMERIC PO Take by mouth 2 (two) times daily.     [provider]    Allergies Morphine and related  Family History  Problem Relation Age of Onset  . Healthy Mother   . Hypertension Father   . Diabetes Paternal Aunt   . Cancer Paternal Grandmother        presumed lung cancer  . Glaucoma Brother   . CAD Neg Hx     Social History Social History   Tobacco Use  . Smoking status: Never Smoker  . Smokeless tobacco: Never Used  Substance Use Topics  . Alcohol use: Yes    Comment: occasional only  . Drug use: No     Review of Systems  Constitutional: Positive fever/chills.  Positive for body  aches Eyes: No visual changes. No discharge ENT: Positive for nasal congestion sore throat Cardiovascular: no chest pain. Respiratory: no cough. No SOB. Gastrointestinal: No abdominal pain.  No nausea, no vomiting.  No diarrhea.  No constipation. Musculoskeletal: Negative for musculoskeletal pain. Skin: Negative for rash, abrasions, lacerations, ecchymosis. Neurological: Negative for headaches, focal weakness or numbness. 10-point ROS otherwise negative.  ____________________________________________   PHYSICAL EXAM:  VITAL SIGNS: ED Triage Vitals  Enc Vitals Group     BP 12/23/18 2123 116/72     Pulse Rate 12/23/18 2123 95      Resp 12/23/18 2123 18     Temp 12/23/18 2123 100.3 F (37.9 C)     Temp Source 12/23/18 2123 Oral     SpO2 12/23/18 2123 97 %     Weight 12/23/18 2122 170 lb (77.1 kg)     Height 12/23/18 2122 5\' 6"  (1.676 m)     Head Circumference --      Peak Flow --      Pain Score 12/23/18 2123 7     Pain Loc --      Pain Edu? --      Excl. in [place]? --      Constitutional: Alert and oriented. Well appearing and in no acute distress. Eyes: Conjunctivae are normal. PERRL. EOMI. Head: Atraumatic. ENT:      Ears: EACs and TMs unremarkable bilaterally.      Nose: Moderate clear congestion/rhinnorhea.      Mouth/Throat: Mucous membranes are moist.  Oropharynx is minimally erythematous but nonedematous.  Tonsils are surgically absent.  Uvula is midline. Neck: No stridor.  Hematological/Lymphatic/Immunilogical: Scattered, mobile, nontender anterior cervical lymphadenopathy. Cardiovascular: Normal rate, regular rhythm. Normal S1 and S2.  Good peripheral circulation. Respiratory: Normal respiratory effort without tachypnea or retractions. Lungs CTAB. Good air entry to the bases with no decreased or absent breath sounds. Gastrointestinal: Bowel sounds 4 quadrants. Soft and nontender to palpation. No guarding or rigidity. No palpable masses. No distention. No CVA tenderness. Musculoskeletal: Full range of motion to all extremities. No gross deformities appreciated. Neurologic:  Normal speech and language. No gross focal neurologic deficits are appreciated.  Skin:  Skin is warm, dry and intact. No rash noted. Psychiatric: Mood and affect are normal. Speech and behavior are normal. Patient exhibits appropriate insight and judgement.   ____________________________________________   LABS (all labs ordered are listed, but only abnormal results are displayed)  Labs Reviewed  INFLUENZA PANEL BY PCR (TYPE A & B) - Abnormal; Notable for the following components:      Result Value   Influenza A By PCR POSITIVE  (*)    All other components within normal limits   ____________________________________________  EKG   ____________________________________________  RADIOLOGY   No results found.  ____________________________________________    PROCEDURES  Procedure(s) performed:    Procedures    Medications  ibuprofen (ADVIL,MOTRIN) tablet 600 mg (600 mg Oral Given 12/23/18 2247)  acetaminophen (TYLENOL) tablet 650 mg (650 mg Oral Given 12/23/18 2247)     ____________________________________________   INITIAL IMPRESSION / ASSESSMENT AND PLAN / ED COURSE  Pertinent labs & imaging results that were available during my care of the patient were reviewed by me and considered in my medical decision making (see chart for details).  Review of the [place] CSRS was performed in accordance of the [place] prior to dispensing any controlled drugs.      Patient's diagnosis is consistent with influenza A.  Patient presents emergency department with sudden onset  of flulike symptoms.  Patient is requesting Tamiflu for his symptoms.  He is taken Tylenol Motrin and plenty of fluids at home.  Tamiflu, Tessalon Perles, Zofran will be prescribed for the patient.  Follow-up primary care as needed..  Patient is given ED precautions to return to the ED for any worsening or new symptoms.     ____________________________________________  FINAL CLINICAL IMPRESSION(S) / ED DIAGNOSES  Final diagnoses:  Influenza A      NEW MEDICATIONS STARTED DURING THIS VISIT:  ED Discharge Orders         Ordered    benzonatate (TESSALON PERLES) 100 MG capsule  Every 6 hours PRN     12/23/18 2248    ondansetron (ZOFRAN-ODT) 4 MG disintegrating tablet  Every 8 hours PRN     12/23/18 2248    oseltamivir (TAMIFLU) 75 MG capsule  2 times daily     12/23/18 2248              This chart was dictated using voice recognition software/Dragon. Despite best efforts to proofread, errors can occur which can change the  meaning. Any change was purely unintentional.    Darletta Moll, PA-C 12/23/18 2252    Eula Listen, MD 12/23/18 [phone]

## 2018-12-23 NOTE — ED Triage Notes (Signed)
Patient ambulatory to triage with steady gait, without difficulty or distress noted, mask in place; pt reports today having fever & cough, body aches

## 2018-12-25 ENCOUNTER — Telehealth: Payer: Self-pay

## 2018-12-25 NOTE — Telephone Encounter (Signed)
Called pt to see how he was feeling after recent ER visit for Flu. He said he is getting a little better.

## 2020-05-10 ENCOUNTER — Emergency Department
Admission: EM | Admit: 2020-05-10 | Discharge: 2020-05-10 | Disposition: A | Discharge status: Home / Self Care | Payer: BC Managed Care – PPO | Attending: Emergency Medicine | Admitting: Emergency Medicine

## 2020-05-10 ENCOUNTER — Encounter: Payer: Self-pay | Admitting: Emergency Medicine

## 2020-05-10 DIAGNOSIS — Z23 Encounter for immunization: Secondary | ICD-10-CM | POA: Insufficient documentation

## 2020-05-10 DIAGNOSIS — Y999 Unspecified external cause status: Secondary | ICD-10-CM | POA: Diagnosis not present

## 2020-05-10 DIAGNOSIS — Y929 Unspecified place or not applicable: Secondary | ICD-10-CM | POA: Insufficient documentation

## 2020-05-10 DIAGNOSIS — Y939 Activity, unspecified: Secondary | ICD-10-CM | POA: Diagnosis not present

## 2020-05-10 DIAGNOSIS — S0101XA Laceration without foreign body of scalp, initial encounter: Secondary | ICD-10-CM

## 2020-05-10 DIAGNOSIS — W228XXA Striking against or struck by other objects, initial encounter: Secondary | ICD-10-CM | POA: Diagnosis not present

## 2020-05-10 MED ORDER — TETANUS-DIPHTH-ACELL PERTUSSIS 5-2.5-18.5 LF-MCG/0.5 IM SUSP
0.5000 mL | Freq: Once | INTRAMUSCULAR | Status: AC
  Administered 2020-05-10: 0.5 mL via INTRAMUSCULAR
  Filled 2020-05-10: qty 0.5

## 2020-05-10 NOTE — Discharge Instructions (Signed)
Keep laceration dry and clean. Wash with warm water and soap. Apply topical bacitracin.  You received 2 staples that must be removed in 7 days.  Watch for signs of infection: pus, redness of the skin surrounding it, or fever. If these develop see your doctor or return to the ER for antibiotics.

## 2020-05-10 NOTE — ED Provider Notes (Signed)
[facility] Emergency Department Provider Note  ____________________________________________  Time seen: Approximately 11:47 PM  I have reviewed the triage vital signs and the nursing notes.   HISTORY  Chief Complaint Laceration   HPI Tommy Franklin is a 54 y.o. male no significant past medical history who presents for evaluation of a scalp laceration.  Patient reports that he was mowing the lawn when he hit a branch.  He developed a laceration of the right side of his scalp.  Unknown last tetanus shot.  Patient denies syncope, headache, neck pain, falling, or any other injuries.  He denies any pain at this time.  He is not on any blood thinners.   Laceration happened just prior to arrival.  Past Medical History:  Diagnosis Date  . Allergic rhinitis due to pollen   . Allergy    seasonal  . 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 Hemorrhoids, external 04/03/2017  . Routine general medical examination at a health care facility 11/05/2012  . Allergic rhinitis due to pollen     Past Surgical History:  Procedure Laterality Date  . TONSILLECTOMY AND ADENOIDECTOMY  1984    Prior to Admission medications   Medication Sig Start Date End Date Taking? Authorizing Provider  beclomethasone (QVAR) 40 MCG/ACT inhaler Inhale 2 puffs into the lungs 2 (two) times daily.    [provider]  Glucosamine 500 MG CAPS Take by mouth. Take 3 capsules daily    [provider]  hydrocortisone 2.5 % cream Apply topically 3 (three) times daily as needed. Patient not taking: Reported on 01/16/2018 04/03/17   Viviana Simpler I, MD  Ibuprofen (ADVIL) 200 MG CAPS Take by mouth as needed.    [provider]  loratadine (CLARITIN) 10 MG tablet Take 10-20 mg by mouth daily.     [provider]  mometasone (NASONEX) 50 MCG/ACT nasal spray Place 2 sprays into the nose daily. Use in spring  allergy season    [provider]  Omega-3 Fatty Acids (FISH OIL) 600 MG CAPS Take 1,200 mg by mouth 2 (two) times daily.    [provider]  ondansetron (ZOFRAN-ODT) 4 MG disintegrating tablet Take 1 tablet (4 mg total) by mouth every 8 (eight) hours as needed for nausea or vomiting. 12/23/18   Cuthriell, Charline Bills, PA-C  oseltamivir (TAMIFLU) 75 MG capsule Take 1 capsule (75 mg total) by mouth 2 (two) times daily. 12/23/18   Cuthriell, Charline Bills, PA-C  TURMERIC PO Take by mouth 2 (two) times daily.     [provider]    Allergies Morphine and related  Family History  Problem Relation Age of Onset  . Healthy Mother   . Hypertension Father   . Diabetes Paternal Aunt   . Cancer Paternal Grandmother        presumed lung cancer  . Glaucoma Brother   . CAD Neg Hx     Social History Social History   Tobacco Use  . Smoking status: Never Smoker  . Smokeless tobacco: Never Used  Substance Use Topics  . Alcohol use: Yes    Comment: occasional only  . Drug use: No    Review of Systems  Constitutional: Negative for fever. Eyes: Negative for visual changes. ENT: Negative for facial injury or neck injury Cardiovascular: Negative for chest injury. Respiratory: Negative for shortness of breath. Negative for chest wall injury. Gastrointestinal:  Negative for abdominal pain or injury. Genitourinary: Negative for dysuria. Musculoskeletal: Negative for back injury, negative for arm or leg pain. Skin: + Scalp laceration Neurological: Negative for head injury.   ____________________________________________   PHYSICAL EXAM:  VITAL SIGNS: ED Triage Vitals  Enc Vitals Group     BP 05/10/20 2029 129/78     Pulse Rate 05/10/20 2029 66     Resp 05/10/20 2029 17     Temp 05/10/20 2029 98.6 F (37 C)     Temp Source 05/10/20 2029 Oral     SpO2 05/10/20 2029 98 %     Weight --      Height --      Head Circumference --      Peak Flow --      Pain Score  05/10/20 2328 6     Pain Loc --      Pain Edu? --      Excl. in [place]? --      Constitutional: Alert and oriented. No acute distress. Does not appear intoxicated. HEENT Head: Normocephalic.  3 cm linear shallow scalp laceration on the right parietal region Face: No facial bony tenderness. Stable midface Ears: No hemotympanum bilaterally. No Battle sign Eyes: No eye injury. PERRL. No raccoon eyes Nose: Nontender. No epistaxis. No rhinorrhea Mouth/Throat: Mucous membranes are moist. No oropharyngeal blood. No dental injury. Airway patent without stridor. Normal voice. Neck: no C-collar. No midline c-spine tenderness.  Cardiovascular: Normal rate, regular rhythm.  Pulmonary/Chest: Normal work of breathing  Musculoskeletal: Nontender with normal full range of motion in all extremities. No deformities. No thoracic or lumbar midline spinal tenderness. Pelvis is stable. Skin: Skin is warm, dry and intact. No abrasions or contutions. Psychiatric: Speech and behavior are appropriate. Neurological: Normal speech and language. Moves all extremities to command. No gross focal neurologic deficits are appreciated.  Glascow Coma Score: 4 - Opens eyes on own 6 - Follows simple motor commands 5 - Alert and oriented GCS: 15   ____________________________________________   LABS (all labs ordered are listed, but only abnormal results are displayed)  Labs Reviewed - No data to display ____________________________________________  EKG  none  ____________________________________________  RADIOLOGY  none  ____________________________________________   PROCEDURES  Procedure(s) performed:yes .Marland KitchenLaceration Repair  Date/Time: 05/10/2020 11:49 PM Performed by: Rudene Re, MD Authorized by: Rudene Re, MD   Consent:    Consent obtained:  Verbal   Consent given by:  Patient   Risks discussed:  Infection, pain, retained foreign body, poor cosmetic result and poor wound  healing Anesthesia (see MAR for exact dosages):    Anesthesia method:  None Laceration details:    Location:  Scalp   Scalp location:  R parietal   Length (cm):  3 Repair type:    Repair type:  Simple Exploration:    Hemostasis achieved with:  Direct pressure   Wound exploration: entire depth of wound probed and visualized     Wound extent: no fascia violation noted, no foreign bodies/material noted, no underlying fracture noted and no vascular damage noted     Contaminated: no   Treatment:    Area cleansed with:  Saline   Amount of cleaning:  Extensive   Irrigation solution:  Sterile saline   Visualized foreign bodies/material removed: no   Skin repair:    Repair method:  Staples   Number of staples:  2 Approximation:    Approximation:  Close Post-procedure details:    Dressing:  Open (no dressing)   Patient  tolerance of procedure:  Tolerated well, no immediate complications   Critical Care performed:  None ____________________________________________   INITIAL IMPRESSION / ASSESSMENT AND PLAN / ED COURSE  54 y.o. male no significant past medical history who presents for evaluation of a scalp laceration.  Patient with a 3 cm linear shallow scalp laceration in the right parietal region.  No other signs of trauma on exam, no scalp hematoma.  Patient is otherwise neurologically intact.  Patient was given a tetanus boost.  Laceration was repaired per procedure note above.  Discussed wound care and staple removal with patient.  Old medical records reviewed.  Discussed my standard return precautions.      ____________________________________________  Please note:  Patient was evaluated in Emergency Department today for the symptoms described in the history of present illness. Patient was evaluated in the context of the global COVID-19 pandemic, which necessitated consideration that the patient might be at risk for infection with the SARS-CoV-2 virus that causes COVID-19.  Institutional protocols and algorithms that pertain to the evaluation of patients at risk for COVID-19 are in a state of rapid change based on information released by regulatory bodies including the CDC and federal and state organizations. These policies and algorithms were followed during the patient's care in the ED.  Some ED evaluations and interventions may be delayed as a result of limited staffing during the pandemic.   ____________________________________________   FINAL CLINICAL IMPRESSION(S) / ED DIAGNOSES   Final diagnoses:  Laceration of scalp, initial encounter      NEW MEDICATIONS STARTED DURING THIS VISIT:  ED Discharge Orders    None       Note:  This document was prepared using Dragon voice recognition software and may include unintentional dictation errors.    Alfred Levins, [place], MD 05/10/20 2350

## 2020-05-10 NOTE — ED Triage Notes (Signed)
Pt reports he was mowing today when he hit a tree limb with the right side of head, leaving approx. 1 inch laceration. Bandage applied, no bleeding at this time.

## 2021-02-10 DIAGNOSIS — S62111A Displaced fracture of triquetrum [cuneiform] bone, right wrist, initial encounter for closed fracture: Secondary | ICD-10-CM | POA: Diagnosis not present

## 2021-03-10 DIAGNOSIS — S62114D Nondisplaced fracture of triquetrum [cuneiform] bone, right wrist, subsequent encounter for fracture with routine healing: Secondary | ICD-10-CM | POA: Diagnosis not present

## 2021-03-10 DIAGNOSIS — S62111A Displaced fracture of triquetrum [cuneiform] bone, right wrist, initial encounter for closed fracture: Secondary | ICD-10-CM | POA: Diagnosis not present

## 2021-07-27 DIAGNOSIS — M62830 Muscle spasm of back: Secondary | ICD-10-CM | POA: Diagnosis not present

## 2021-07-27 DIAGNOSIS — M9902 Segmental and somatic dysfunction of thoracic region: Secondary | ICD-10-CM | POA: Diagnosis not present

## 2021-07-27 DIAGNOSIS — M5414 Radiculopathy, thoracic region: Secondary | ICD-10-CM | POA: Diagnosis not present

## 2021-07-27 DIAGNOSIS — M7918 Myalgia, other site: Secondary | ICD-10-CM | POA: Diagnosis not present

## 2022-04-17 DIAGNOSIS — Z0000 Encounter for general adult medical examination without abnormal findings: Secondary | ICD-10-CM | POA: Diagnosis not present

## 2022-05-02 DIAGNOSIS — M9900 Segmental and somatic dysfunction of head region: Secondary | ICD-10-CM | POA: Diagnosis not present

## 2022-05-02 DIAGNOSIS — M7918 Myalgia, other site: Secondary | ICD-10-CM | POA: Diagnosis not present

## 2022-05-02 DIAGNOSIS — M26603 Bilateral temporomandibular joint disorder, unspecified: Secondary | ICD-10-CM | POA: Diagnosis not present

## 2022-05-02 DIAGNOSIS — M9901 Segmental and somatic dysfunction of cervical region: Secondary | ICD-10-CM | POA: Diagnosis not present

## 2022-05-05 DIAGNOSIS — M26603 Bilateral temporomandibular joint disorder, unspecified: Secondary | ICD-10-CM | POA: Diagnosis not present

## 2022-05-05 DIAGNOSIS — M9900 Segmental and somatic dysfunction of head region: Secondary | ICD-10-CM | POA: Diagnosis not present

## 2022-05-05 DIAGNOSIS — M7918 Myalgia, other site: Secondary | ICD-10-CM | POA: Diagnosis not present

## 2022-05-05 DIAGNOSIS — M9901 Segmental and somatic dysfunction of cervical region: Secondary | ICD-10-CM | POA: Diagnosis not present

## 2022-05-08 DIAGNOSIS — Z0000 Encounter for general adult medical examination without abnormal findings: Secondary | ICD-10-CM | POA: Diagnosis not present

## 2022-05-12 DIAGNOSIS — M26603 Bilateral temporomandibular joint disorder, unspecified: Secondary | ICD-10-CM | POA: Diagnosis not present

## 2022-05-12 DIAGNOSIS — M9901 Segmental and somatic dysfunction of cervical region: Secondary | ICD-10-CM | POA: Diagnosis not present

## 2022-05-12 DIAGNOSIS — M9900 Segmental and somatic dysfunction of head region: Secondary | ICD-10-CM | POA: Diagnosis not present

## 2022-05-12 DIAGNOSIS — M7918 Myalgia, other site: Secondary | ICD-10-CM | POA: Diagnosis not present

## 2022-05-22 DIAGNOSIS — M9901 Segmental and somatic dysfunction of cervical region: Secondary | ICD-10-CM | POA: Diagnosis not present

## 2022-05-22 DIAGNOSIS — M9900 Segmental and somatic dysfunction of head region: Secondary | ICD-10-CM | POA: Diagnosis not present

## 2022-05-22 DIAGNOSIS — M26603 Bilateral temporomandibular joint disorder, unspecified: Secondary | ICD-10-CM | POA: Diagnosis not present

## 2022-05-22 DIAGNOSIS — M7918 Myalgia, other site: Secondary | ICD-10-CM | POA: Diagnosis not present

## 2023-02-09 ENCOUNTER — Encounter: Payer: Self-pay | Admitting: Gastroenterology

## 2023-02-20 ENCOUNTER — Encounter: Payer: Self-pay | Admitting: Gastroenterology

## 2023-03-30 ENCOUNTER — Ambulatory Visit (AMBULATORY_SURGERY_CENTER): Payer: Commercial Managed Care - PPO | Admitting: *Deleted

## 2023-03-30 VITALS — Ht 66.0 in | Wt 180.0 lb

## 2023-03-30 DIAGNOSIS — Z86010 Personal history of colonic polyps: Secondary | ICD-10-CM

## 2023-03-30 MED ORDER — NA SULFATE-K SULFATE-MG SULF 17.5-3.13-1.6 GM/177ML PO SOLN: 1.0000 | Freq: Once | ORAL | 0 refills | Status: AC

## 2023-03-30 NOTE — Progress Notes (Signed)
Pt's name and DOB verified at the beginning of the pre-visit.  Pt denies any difficulty with ambulating,sitting, laying down or rolling side to side Gave both [facility] main # and MD on call # prior to instructions.  No egg or soy allergy known to patient  No issues known to pt with past sedation with any surgeries or procedures Patient denies ever being intubated Pt has no issues moving head neck or swallowing No FH of Malignant Hyperthermia Pt is not on diet pills Pt is not on home 02  Pt is not on blood thinners  Pt denies issues with constipation  Pt is not on dialysis Pt denise any abnormal heart rhythms  Pt denies any upcoming cardiac testing Pt encouraged to use to use Singlecare or Goodrx to reduce cost  Patient's chart reviewed by Tommy Franklin CNRA prior to pre-visit and patient appropriate for the [facility].  Pre-visit completed and red dot placed by patient's name on their procedure day (on provider's schedule).  . Visit by phone Pt states weight is 180 lb Instructed pt why it is important to and  to call if they have any changes in health or new medications. Directed them to the # given and on instructions.   Pt states they will.  Instructions reviewed with pt and pt states understanding. Instructed to review again prior to procedure. Pt states they will.  Instructions sent by mail with coupon and by my chart

## 2023-04-03 ENCOUNTER — Encounter: Payer: Self-pay | Admitting: Gastroenterology

## 2023-04-17 ENCOUNTER — Encounter: Payer: Self-pay | Admitting: Gastroenterology

## 2023-04-17 ENCOUNTER — Ambulatory Visit (AMBULATORY_SURGERY_CENTER): Payer: Commercial Managed Care - PPO | Admitting: Gastroenterology

## 2023-04-17 VITALS — BP 97/65 | HR 58 | Temp 98.4°F | Resp 10 | Ht 66.0 in | Wt 180.0 lb

## 2023-04-17 DIAGNOSIS — K635 Polyp of colon: Secondary | ICD-10-CM | POA: Diagnosis not present

## 2023-04-17 DIAGNOSIS — D122 Benign neoplasm of ascending colon: Secondary | ICD-10-CM

## 2023-04-17 DIAGNOSIS — Z09 Encounter for follow-up examination after completed treatment for conditions other than malignant neoplasm: Secondary | ICD-10-CM | POA: Diagnosis present

## 2023-04-17 DIAGNOSIS — D124 Benign neoplasm of descending colon: Secondary | ICD-10-CM | POA: Diagnosis not present

## 2023-04-17 DIAGNOSIS — Z86010 Personal history of colonic polyps: Secondary | ICD-10-CM | POA: Diagnosis not present

## 2023-04-17 DIAGNOSIS — D123 Benign neoplasm of transverse colon: Secondary | ICD-10-CM

## 2023-04-17 MED ORDER — SODIUM CHLORIDE 0.9 % IV SOLN: 500.0000 mL | Freq: Once | INTRAVENOUS | Status: DC

## 2023-04-17 NOTE — Op Note (Signed)
[facility] Patient Name: Tommy Franklin Procedure Date: 04/17/2023 8:00 AM MRN: [id number] Endoscopist: Viviann Spare P. Adela Lank , MD, [id number] Age: 57 Referring MD:  Date of Birth: 06-Oct-1966 Gender: Male Account #: [id number] Procedure:                Colonoscopy Indications:              High risk colon cancer surveillance: Personal                            history of colonic polyps - 2 sessile serrated                            polyps removed 01/2018 Medicines:                Monitored Anesthesia Care Procedure:                Pre-Anesthesia Assessment:                           - Prior to the procedure, a History and Physical                            was performed, and patient medications and                            allergies were reviewed. The patient's tolerance of                            previous anesthesia was also reviewed. The risks                            and benefits of the procedure and the sedation                            options and risks were discussed with the patient.                            All questions were answered, and informed consent                            was obtained. Prior Anticoagulants: The patient has                            taken no anticoagulant or antiplatelet agents. ASA                            Grade Assessment: II - A patient with mild systemic                            disease. After reviewing the risks and benefits,                            the patient was deemed in satisfactory condition to  undergo the procedure.                           After obtaining informed consent, the colonoscope                            was passed under direct vision. Throughout the                            procedure, the patient's blood pressure, pulse, and                            oxygen saturations were monitored continuously. The                            CF [id number] #[id number] was introduced through  the anus                            and advanced to the the cecum, identified by                            appendiceal orifice and ileocecal valve. The                            colonoscopy was performed without difficulty. The                            patient tolerated the procedure well. The quality                            of the bowel preparation was good. The ileocecal                            valve, appendiceal orifice, and rectum were                            photographed. Scope In: 8:07:51 AM Scope Out: 8:31:15 AM Scope Withdrawal Time: 0 hours 20 minutes 32 seconds  Total Procedure Duration: 0 hours 23 minutes 24 seconds  Findings:                 The perianal and digital rectal examinations were                            normal.                           Two sessile polyps were found in the ascending                            colon. The polyps were 3 to 4 mm in size. These                            polyps were removed with a cold snare. Resection  and retrieval were complete.                           A 4 to 5 mm polyp was found in the hepatic flexure.                            The polyp was sessile. The polyp was removed with a                            cold snare. Resection and retrieval were complete.                           Two flat polyps were found in the transverse colon.                            The polyps were 4 to 7 mm in size. These polyps                            were removed with a cold snare. Resection and                            retrieval were complete.                           A 4 mm polyp was found in the descending colon. The                            polyp was flat. The polyp was removed with a cold                            snare. Resection and retrieval were complete.                           A few small-mouthed diverticula were found in the                            sigmoid colon.                            Internal hemorrhoids were found during                            retroflexion. The hemorrhoids were small.                           The exam was otherwise without abnormality. Complications:            No immediate complications. Estimated blood loss:                            Minimal. Estimated Blood Loss:     Estimated blood loss was minimal. Impression:               - Two 3 to 4 mm polyps in the ascending  colon,                            removed with a cold snare. Resected and retrieved.                           - One 4 to 5 mm polyp at the hepatic flexure,                            removed with a cold snare. Resected and retrieved.                           - Two 4 to 7 mm polyps in the transverse colon,                            removed with a cold snare. Resected and retrieved.                           - One 4 mm polyp in the descending colon, removed                            with a cold snare. Resected and retrieved.                           - Diverticulosis in the sigmoid colon.                           - Internal hemorrhoids.                           - The examination was otherwise normal. Recommendation:           - Patient has a contact number available for                            emergencies. The signs and symptoms of potential                            delayed complications were discussed with the                            patient. Return to normal activities tomorrow.                            Written discharge instructions were provided to the                            patient.                           - Resume previous diet.                           - Continue present medications.                           -  Await pathology results. Viviann Spare P. Ryoma Nofziger, MD 04/17/2023 8:36:50 AM This report has been signed electronically.

## 2023-04-17 NOTE — Progress Notes (Signed)
VS by CW  Pt's states no medical or surgical changes since previsit or office visit.  

## 2023-04-17 NOTE — Progress Notes (Signed)
A and O x3. Report to RN. Tolerated MAC anesthesia well. 

## 2023-04-17 NOTE — Progress Notes (Signed)
[facility] History and Physical   Primary Care Physician:  Scot Jun, MD   Reason for Procedure:   History of colon polyps  Plan:    colonoscopy     HPI: Tommy Franklin is a 57 y.o. male  here for colonoscopy surveillance - 2 sessile serrated polyps removed 01/2018.    Patient denies any bowel symptoms at this time. No family history of colon cancer known. Otherwise feels well without any cardiopulmonary symptoms.   I have discussed risks / benefits of anesthesia and endoscopic procedure with Karle Plumber and they wish to proceed with the exams as outlined today.    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 Authorizing Provider  Glucosamine 500 MG CAPS Take by mouth. Take 3 capsules daily   Yes [provider]  Omega-3 Fatty Acids (FISH OIL) 600 MG CAPS Take 1,200 mg by mouth 2 (two) times daily.   Yes [provider]  TURMERIC PO Take by mouth 2 (two) times daily.    Yes [provider]  beclomethasone (QVAR) 40 MCG/ACT inhaler Inhale 2 puffs into the lungs 2 (two) times daily.    [provider]  hydrocortisone 2.5 % cream Apply topically 3 (three) times daily as needed. Patient not taking: Reported on 01/16/2018 04/03/17   Tillman Abide I, MD  Ibuprofen (ADVIL) 200 MG CAPS Take by mouth as needed.    [provider]  loratadine (CLARITIN) 10 MG tablet Take 10-20 mg by mouth daily.     [provider]  mometasone (NASONEX) 50 MCG/ACT nasal spray Place 2 sprays into the nose daily. Use in spring allergy season    [provider]  ondansetron (ZOFRAN-ODT) 4 MG  disintegrating tablet Take 1 tablet (4 mg total) by mouth every 8 (eight) hours as needed for nausea or vomiting. 12/23/18   Cuthriell, Delorise Royals, PA-C  oseltamivir (TAMIFLU) 75 MG capsule Take 1 capsule (75 mg total) by mouth 2 (two) times daily. Patient not taking: Reported on 03/30/2023 12/23/18   Cuthriell, Delorise Royals, PA-C    Current Outpatient Medications  Medication Sig Dispense Refill   Glucosamine 500 MG CAPS Take by mouth. Take 3 capsules daily     Omega-3 Fatty Acids (FISH OIL) 600 MG CAPS Take 1,200 mg by mouth 2 (two) times daily.     TURMERIC PO Take by mouth 2 (two) times daily.      beclomethasone (QVAR) 40 MCG/ACT inhaler Inhale 2 puffs into the lungs 2 (two) times daily.     hydrocortisone 2.5 % cream Apply topically 3 (three) times daily as needed. (Patient not taking: Reported on 01/16/2018) 28 g 3   Ibuprofen (ADVIL) 200 MG CAPS Take by mouth as needed.     loratadine (CLARITIN) 10 MG tablet Take 10-20 mg by mouth daily.      mometasone (NASONEX) 50 MCG/ACT nasal spray Place 2 sprays into the nose daily. Use in spring allergy season     ondansetron (ZOFRAN-ODT) 4 MG disintegrating tablet Take 1 tablet (4 mg total) by mouth  every 8 (eight) hours as needed for nausea or vomiting. 20 tablet 0   oseltamivir (TAMIFLU) 75 MG capsule Take 1 capsule (75 mg total) by mouth 2 (two) times daily. (Patient not taking: Reported on 03/30/2023) 10 capsule 0   Current Facility-Administered Medications  Medication Dose Route Frequency Provider Last Rate Last Admin   0.9 %  sodium chloride infusion  500 mL Intravenous Once Mikeyla Music, Willaim Rayas, MD        Allergies as of 04/17/2023 - Review Complete 04/17/2023  Allergen Reaction Noted   Morphine and codeine  01/16/2018    Family History  Problem Relation Age of Onset   Healthy Mother    Hypertension Father    Glaucoma Brother    Diabetes Paternal Aunt    Cancer Paternal Grandmother        presumed lung cancer   CAD Neg Hx    Colon  polyps Neg Hx    Colon cancer Neg Hx    Esophageal cancer Neg Hx    Stomach cancer Neg Hx    Rectal cancer Neg Hx     Social History   Socioeconomic History   Marital status: Married    Spouse name: Not on file   Number of children: 2   Years of education: Not on file   Highest education level: Not on file  Occupational History   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[employer]  Tobacco Use   Smoking status: Never   Smokeless tobacco: Never  Vaping Use   Vaping Use: Never used  Substance and Sexual Activity   Alcohol use: Yes    Comment: occasional only   Drug use: No   Sexual activity: Not on file  Other Topics Concern   Not on file  Social History Narrative   Not on file   Social Determinants of Health   Financial Resource Strain: Not on file  Food Insecurity: Not on file  Transportation Needs: Not on file  Physical Activity: Not on file  Stress: Not on file  Social Connections: Not on file  Intimate Partner Violence: Not on file    Review of Systems: All other review of systems negative except as mentioned in the HPI.  Physical Exam: Vital signs BP 127/66   Pulse 63   Temp 98.4 F (36.9 C)   Ht 5\' 6"  (1.676 m)   Wt 180 lb (81.6 kg)   SpO2 100%   BMI 29.05 kg/m   General:   Alert,  Well-developed, pleasant and cooperative in NAD Lungs:  Clear throughout to auscultation.   Heart:  Regular rate and rhythm Abdomen:  Soft, nontender and nondistended.   Neuro/Psych:  Alert and cooperative. Normal mood and affect. A and O x 3  Harlin Rain, MD [facility]

## 2023-04-17 NOTE — Progress Notes (Signed)
Called to room to assist during endoscopic procedure.  Patient ID and intended procedure confirmed with present staff. Received instructions for my participation in the procedure from the performing physician.  

## 2023-04-17 NOTE — Patient Instructions (Signed)
Resume all of your previous medications today.  YOU HAD AN ENDOSCOPIC PROCEDURE TODAY AT THE [facility]:   Refer to the procedure report that was given to you for any specific questions about what was found during the examination.  If the procedure report does not answer your questions, please call your gastroenterologist to clarify.  If you requested that your care partner not be given the details of your procedure findings, then the procedure report has been included in a sealed envelope for you to review at your convenience later.  YOU SHOULD EXPECT: Some feelings of bloating in the abdomen. Passage of more gas than usual.  Walking can help get rid of the air that was put into your GI tract during the procedure and reduce the bloating. If you had a lower endoscopy (such as a colonoscopy or flexible sigmoidoscopy) you may notice spotting of blood in your stool or on the toilet paper. If you underwent a bowel prep for your procedure, you may not have a normal bowel movement for a few days.  Please Note:  You might notice some irritation and congestion in your nose or some drainage.  This is from the oxygen used during your procedure.  There is no need for concern and it should clear up in a day or so.  SYMPTOMS TO REPORT IMMEDIATELY:  Following lower endoscopy (colonoscopy or flexible sigmoidoscopy):  Excessive amounts of blood in the stool  Significant tenderness or worsening of abdominal pains  Swelling of the abdomen that is new, acute  Fever of 100F or higher   For urgent or emergent issues, a gastroenterologist can be reached at any hour by calling [phone]. Do not use MyChart messaging for urgent concerns.    DIET:  We do recommend a small meal at first, but then you may proceed to your regular diet.  Drink plenty of fluids but you should avoid alcoholic beverages for 24 hours.  ACTIVITY:  You should plan to take it easy for the rest of today and you should NOT  DRIVE or use heavy machinery until tomorrow (because of the sedation medicines used during the test).    FOLLOW UP: Our staff will call the number listed on your records the next business day following your procedure.  We will call around 7:15- 8:00 am to check on you and address any questions or concerns that you may have regarding the information given to you following your procedure. If we do not reach you, we will leave a message.     If any biopsies were taken you will be contacted by phone or by letter within the next 1-3 weeks.  Please call us at [phone] if you have not heard about the biopsies in 3 weeks.    SIGNATURES/CONFIDENTIALITY: You and/or your care partner have signed paperwork which will be entered into your electronic medical record.  These signatures attest to the fact that that the information above on your After Visit Summary has been reviewed and is understood.  Full responsibility of the confidentiality of this discharge information lies with you and/or your care-partner. 

## 2023-04-18 ENCOUNTER — Telehealth: Payer: Self-pay | Admitting: *Deleted

## 2023-04-18 NOTE — Telephone Encounter (Signed)
Post procedure follow up call placed, no answer and no VM.  

## 2023-04-25 ENCOUNTER — Encounter: Payer: Self-pay | Admitting: Gastroenterology
